# Patient Record
Sex: Female | Born: 2005 | Race: White | Hispanic: No | Marital: Single | Smoking: Never smoker
Health system: Southern US, Community
[De-identification: ages and names within clinical notes are randomized; demographics above are authoritative.]

---
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
## 2023-01-08 DIAGNOSIS — S93492A Sprain of other ligament of left ankle, initial encounter: Secondary | ICD-10-CM | POA: Diagnosis not present

## 2023-01-08 DIAGNOSIS — X500XXA Overexertion from strenuous movement or load, initial encounter: Secondary | ICD-10-CM | POA: Diagnosis not present

## 2023-01-08 DIAGNOSIS — M7752 Other enthesopathy of left foot: Secondary | ICD-10-CM | POA: Diagnosis not present

## 2023-01-08 DIAGNOSIS — M25572 Pain in left ankle and joints of left foot: Secondary | ICD-10-CM | POA: Diagnosis not present

## 2023-01-08 DIAGNOSIS — Y9301 Activity, walking, marching and hiking: Secondary | ICD-10-CM | POA: Diagnosis not present

## 2023-01-16 DIAGNOSIS — F321 Major depressive disorder, single episode, moderate: Secondary | ICD-10-CM | POA: Diagnosis not present

## 2023-02-13 DIAGNOSIS — F321 Major depressive disorder, single episode, moderate: Secondary | ICD-10-CM | POA: Diagnosis not present

## 2023-03-06 DIAGNOSIS — F321 Major depressive disorder, single episode, moderate: Secondary | ICD-10-CM | POA: Diagnosis not present

## 2023-04-17 DIAGNOSIS — Z0000 Encounter for general adult medical examination without abnormal findings: Secondary | ICD-10-CM | POA: Diagnosis not present

## 2023-04-18 DIAGNOSIS — F321 Major depressive disorder, single episode, moderate: Secondary | ICD-10-CM | POA: Diagnosis not present

## 2023-05-18 DIAGNOSIS — R0981 Nasal congestion: Secondary | ICD-10-CM | POA: Diagnosis not present

## 2023-06-02 DIAGNOSIS — J329 Chronic sinusitis, unspecified: Secondary | ICD-10-CM | POA: Diagnosis not present

## 2023-06-02 DIAGNOSIS — J029 Acute pharyngitis, unspecified: Secondary | ICD-10-CM | POA: Diagnosis not present

## 2023-06-02 DIAGNOSIS — J069 Acute upper respiratory infection, unspecified: Secondary | ICD-10-CM | POA: Diagnosis not present

## 2023-06-20 DIAGNOSIS — F321 Major depressive disorder, single episode, moderate: Secondary | ICD-10-CM | POA: Diagnosis not present

## 2023-06-29 DIAGNOSIS — F332 Major depressive disorder, recurrent severe without psychotic features: Secondary | ICD-10-CM | POA: Diagnosis not present

## 2023-07-06 DIAGNOSIS — F332 Major depressive disorder, recurrent severe without psychotic features: Secondary | ICD-10-CM | POA: Diagnosis not present

## 2023-07-11 DIAGNOSIS — R040 Epistaxis: Secondary | ICD-10-CM | POA: Diagnosis not present

## 2023-07-18 DIAGNOSIS — F332 Major depressive disorder, recurrent severe without psychotic features: Secondary | ICD-10-CM | POA: Diagnosis not present

## 2023-07-21 DIAGNOSIS — R040 Epistaxis: Secondary | ICD-10-CM | POA: Diagnosis not present

## 2023-08-01 DIAGNOSIS — F332 Major depressive disorder, recurrent severe without psychotic features: Secondary | ICD-10-CM | POA: Diagnosis not present

## 2023-08-03 DIAGNOSIS — Z113 Encounter for screening for infections with a predominantly sexual mode of transmission: Secondary | ICD-10-CM | POA: Diagnosis not present

## 2023-08-03 DIAGNOSIS — J029 Acute pharyngitis, unspecified: Secondary | ICD-10-CM | POA: Diagnosis not present

## 2023-08-03 DIAGNOSIS — Z1330 Encounter for screening examination for mental health and behavioral disorders, unspecified: Secondary | ICD-10-CM | POA: Diagnosis not present

## 2023-08-03 DIAGNOSIS — Z3200 Encounter for pregnancy test, result unknown: Secondary | ICD-10-CM | POA: Diagnosis not present

## 2023-08-15 DIAGNOSIS — F909 Attention-deficit hyperactivity disorder, unspecified type: Secondary | ICD-10-CM | POA: Diagnosis not present

## 2023-08-15 DIAGNOSIS — F332 Major depressive disorder, recurrent severe without psychotic features: Secondary | ICD-10-CM | POA: Diagnosis not present

## 2023-08-24 DIAGNOSIS — F909 Attention-deficit hyperactivity disorder, unspecified type: Secondary | ICD-10-CM | POA: Diagnosis not present

## 2023-08-24 DIAGNOSIS — F332 Major depressive disorder, recurrent severe without psychotic features: Secondary | ICD-10-CM | POA: Diagnosis not present

## 2023-09-11 DIAGNOSIS — F909 Attention-deficit hyperactivity disorder, unspecified type: Secondary | ICD-10-CM | POA: Diagnosis not present

## 2023-09-11 DIAGNOSIS — F332 Major depressive disorder, recurrent severe without psychotic features: Secondary | ICD-10-CM | POA: Diagnosis not present

## 2023-09-20 DIAGNOSIS — F332 Major depressive disorder, recurrent severe without psychotic features: Secondary | ICD-10-CM | POA: Diagnosis not present

## 2023-09-20 DIAGNOSIS — F909 Attention-deficit hyperactivity disorder, unspecified type: Secondary | ICD-10-CM | POA: Diagnosis not present

## 2023-10-04 DIAGNOSIS — F332 Major depressive disorder, recurrent severe without psychotic features: Secondary | ICD-10-CM | POA: Diagnosis not present

## 2023-10-04 DIAGNOSIS — F909 Attention-deficit hyperactivity disorder, unspecified type: Secondary | ICD-10-CM | POA: Diagnosis not present

## 2023-10-23 DIAGNOSIS — F909 Attention-deficit hyperactivity disorder, unspecified type: Secondary | ICD-10-CM | POA: Diagnosis not present

## 2023-10-23 DIAGNOSIS — F332 Major depressive disorder, recurrent severe without psychotic features: Secondary | ICD-10-CM | POA: Diagnosis not present

## 2023-10-27 DIAGNOSIS — F5101 Primary insomnia: Secondary | ICD-10-CM | POA: Diagnosis not present

## 2023-10-27 DIAGNOSIS — F419 Anxiety disorder, unspecified: Secondary | ICD-10-CM | POA: Diagnosis not present

## 2023-10-27 DIAGNOSIS — N946 Dysmenorrhea, unspecified: Secondary | ICD-10-CM | POA: Diagnosis not present

## 2023-10-27 DIAGNOSIS — F900 Attention-deficit hyperactivity disorder, predominantly inattentive type: Secondary | ICD-10-CM | POA: Diagnosis not present

## 2023-11-15 DIAGNOSIS — F909 Attention-deficit hyperactivity disorder, unspecified type: Secondary | ICD-10-CM | POA: Diagnosis not present

## 2023-11-15 DIAGNOSIS — F332 Major depressive disorder, recurrent severe without psychotic features: Secondary | ICD-10-CM | POA: Diagnosis not present

## 2023-11-28 DIAGNOSIS — F909 Attention-deficit hyperactivity disorder, unspecified type: Secondary | ICD-10-CM | POA: Diagnosis not present

## 2023-11-28 DIAGNOSIS — F332 Major depressive disorder, recurrent severe without psychotic features: Secondary | ICD-10-CM | POA: Diagnosis not present

## 2023-11-29 DIAGNOSIS — F900 Attention-deficit hyperactivity disorder, predominantly inattentive type: Secondary | ICD-10-CM | POA: Diagnosis not present

## 2023-11-29 DIAGNOSIS — F419 Anxiety disorder, unspecified: Secondary | ICD-10-CM | POA: Diagnosis not present

## 2023-11-29 DIAGNOSIS — G4721 Circadian rhythm sleep disorder, delayed sleep phase type: Secondary | ICD-10-CM | POA: Diagnosis not present

## 2023-12-13 DIAGNOSIS — F909 Attention-deficit hyperactivity disorder, unspecified type: Secondary | ICD-10-CM | POA: Diagnosis not present

## 2023-12-13 DIAGNOSIS — F332 Major depressive disorder, recurrent severe without psychotic features: Secondary | ICD-10-CM | POA: Diagnosis not present

## 2023-12-26 DIAGNOSIS — F332 Major depressive disorder, recurrent severe without psychotic features: Secondary | ICD-10-CM | POA: Diagnosis not present

## 2023-12-26 DIAGNOSIS — F909 Attention-deficit hyperactivity disorder, unspecified type: Secondary | ICD-10-CM | POA: Diagnosis not present

## 2024-01-03 DIAGNOSIS — J069 Acute upper respiratory infection, unspecified: Secondary | ICD-10-CM | POA: Diagnosis not present

## 2024-02-01 DIAGNOSIS — G4721 Circadian rhythm sleep disorder, delayed sleep phase type: Secondary | ICD-10-CM | POA: Diagnosis not present

## 2024-02-29 DIAGNOSIS — Z1330 Encounter for screening examination for mental health and behavioral disorders, unspecified: Secondary | ICD-10-CM | POA: Diagnosis not present
